# Patient Record
Sex: Male | Born: 1970 | Race: Black or African American | Hispanic: No | Marital: Married | Smoking: Former smoker
Health system: Southern US, Community
[De-identification: ages and names within clinical notes are randomized; demographics above are authoritative.]

## PROBLEM LIST (undated history)

## (undated) DIAGNOSIS — K219 Gastro-esophageal reflux disease without esophagitis: Secondary | ICD-10-CM

## (undated) DIAGNOSIS — J45909 Unspecified asthma, uncomplicated: Secondary | ICD-10-CM

## (undated) DIAGNOSIS — I10 Essential (primary) hypertension: Secondary | ICD-10-CM

## (undated) HISTORY — DX: Gastro-esophageal reflux disease without esophagitis: K21.9

## (undated) HISTORY — DX: Essential (primary) hypertension: I10

## (undated) HISTORY — PX: BACK SURGERY: SHX140

## (undated) HISTORY — DX: Unspecified asthma, uncomplicated: J45.909

---

## 1999-07-18 HISTORY — PX: WRIST ARTHROCENTESIS: SUR48

## 2000-01-03 ENCOUNTER — Emergency Department (HOSPITAL_COMMUNITY): Admission: EM | Admit: 2000-01-03 | Discharge: 2000-01-03 | Payer: Self-pay | Admitting: Emergency Medicine

## 2001-02-04 ENCOUNTER — Emergency Department (HOSPITAL_COMMUNITY): Admission: EM | Admit: 2001-02-04 | Discharge: 2001-02-04 | Payer: Self-pay | Admitting: Emergency Medicine

## 2001-05-24 ENCOUNTER — Emergency Department (HOSPITAL_COMMUNITY): Admission: EM | Admit: 2001-05-24 | Discharge: 2001-05-24 | Payer: Self-pay | Admitting: Emergency Medicine

## 2001-12-09 ENCOUNTER — Emergency Department (HOSPITAL_COMMUNITY): Admission: EM | Admit: 2001-12-09 | Discharge: 2001-12-09 | Payer: Self-pay | Admitting: Emergency Medicine

## 2001-12-14 ENCOUNTER — Encounter: Payer: Self-pay | Admitting: Family Medicine

## 2001-12-14 ENCOUNTER — Ambulatory Visit (HOSPITAL_COMMUNITY): Admission: RE | Admit: 2001-12-14 | Discharge: 2001-12-14 | Payer: Self-pay | Admitting: Family Medicine

## 2002-04-28 ENCOUNTER — Emergency Department (HOSPITAL_COMMUNITY): Admission: EM | Admit: 2002-04-28 | Discharge: 2002-04-28 | Payer: Self-pay | Admitting: Emergency Medicine

## 2002-07-19 ENCOUNTER — Encounter: Payer: Self-pay | Admitting: Family Medicine

## 2002-07-19 ENCOUNTER — Ambulatory Visit: Admission: RE | Admit: 2002-07-19 | Discharge: 2002-07-19 | Payer: Self-pay | Admitting: Family Medicine

## 2002-10-11 ENCOUNTER — Emergency Department (HOSPITAL_COMMUNITY): Admission: EM | Admit: 2002-10-11 | Discharge: 2002-10-11 | Payer: Self-pay | Admitting: *Deleted

## 2003-09-25 ENCOUNTER — Ambulatory Visit (HOSPITAL_COMMUNITY): Admission: RE | Admit: 2003-09-25 | Discharge: 2003-09-25 | Payer: Self-pay | Admitting: Orthopedic Surgery

## 2005-01-08 ENCOUNTER — Emergency Department (HOSPITAL_COMMUNITY): Admission: EM | Admit: 2005-01-08 | Discharge: 2005-01-08 | Payer: Self-pay | Admitting: Emergency Medicine

## 2005-05-08 ENCOUNTER — Encounter: Admission: RE | Admit: 2005-05-08 | Discharge: 2005-08-06 | Payer: Self-pay | Admitting: *Deleted

## 2005-05-11 ENCOUNTER — Ambulatory Visit (HOSPITAL_COMMUNITY): Admission: RE | Admit: 2005-05-11 | Discharge: 2005-05-11 | Payer: Self-pay | Admitting: *Deleted

## 2005-06-05 ENCOUNTER — Ambulatory Visit (HOSPITAL_COMMUNITY): Admission: RE | Admit: 2005-06-05 | Discharge: 2005-06-06 | Payer: Self-pay | Admitting: Neurosurgery

## 2005-11-10 ENCOUNTER — Emergency Department (HOSPITAL_COMMUNITY): Admission: EM | Admit: 2005-11-10 | Discharge: 2005-11-10 | Payer: Self-pay | Admitting: *Deleted

## 2006-02-12 ENCOUNTER — Emergency Department (HOSPITAL_COMMUNITY): Admission: EM | Admit: 2006-02-12 | Discharge: 2006-02-12 | Payer: Self-pay | Admitting: Emergency Medicine

## 2006-04-26 ENCOUNTER — Ambulatory Visit (HOSPITAL_COMMUNITY): Admission: RE | Admit: 2006-04-26 | Discharge: 2006-04-26 | Payer: Self-pay | Admitting: Family Medicine

## 2007-08-19 ENCOUNTER — Emergency Department (HOSPITAL_COMMUNITY): Admission: EM | Admit: 2007-08-19 | Discharge: 2007-08-19 | Payer: Self-pay | Admitting: Emergency Medicine

## 2009-02-17 ENCOUNTER — Encounter: Admission: RE | Admit: 2009-02-17 | Discharge: 2009-02-17 | Payer: Self-pay | Admitting: Neurosurgery

## 2010-12-02 NOTE — Op Note (Signed)
Damon Payne, [name] NO.:  [id number]   MEDICAL RECORD NO.:  [id number]          PATIENT TYPE:  OIB   LOCATION:  3004                         FACILITY:  [facility]   PHYSICIAN:  Cristi Loron, M.D.DATE OF BIRTH:  1971-01-05   DATE OF PROCEDURE:  06/06/2005  DATE OF DISCHARGE:  06/06/2005                                 OPERATIVE REPORT   BRIEF HISTORY:  The patient is a 40 year old black male, who has suffered  from back and right leg pain.  He had failed medical management and was  worked up with a lumbar MRI, which demonstrated a herniated disk at L2-3 on  the right.  I discussed the various treatment options with him including  surgery.  The patient weighed the risks, benefits, and alternatives to  surgery and decided to proceed with a right L2-3 microdiskectomy.   PREOPERATIVE DIAGNOSES:  1.  Right L2-3 herniated nucleus pulposus.  2.  Spinal stenosis.  3.  Lumbar radiculopathy with lumbago.   POSTOPERATIVE DIAGNOSES:  1.  Right L2-3 herniated nucleus pulposus.  2.  Spinal stenosis.  3.  Lumbar radiculopathy with lumbago.   PROCEDURE:  Right L2-3 microdiskectomy using microdissection.   SURGEON:  Cristi Loron, MD.   ASSISTANT:  Stefani Dama, MD.   ANESTHESIA:  General endotracheal.   ESTIMATED BLOOD LOSS:  Minimal.   SPECIMENS:  None.   DRAINS:  None.   COMPLICATIONS:  None.   DESCRIPTION OF PROCEDURE:  The patient was brought to the operating room by  the Anesthesia team.  General endotracheal anesthesia was induced.  The  patient was then carefully turned to a prone position on a Wilson frame.  His lumbosacral region was then prepared with Betadine scrub and Betadine  solution.  Sterile drapes were applied.  I then injected the area to be  incised with Marcaine with epinephrine solution.  I then used a scalpel to  make a linear midline incision over the L2-3 interspace.  I used the  electrocautery to perform a right-sided  subperiosteal dissection exposing  the right spinous process of the lamina of L-2 and L-3.  We obtained  intraoperative radiographs to confirm our location.  We then inserted the  [facility] retractor and then brought the operative microscope into the  field, and under image magnification and illumination completed the  microdiskectomy/decompression.  We used the drill to perform a right L2  laminotomy.  We ran the laminotomy with a Kerrison punch, removing the L2-3  ligamentum flavum and performing a foraminotomy about the right L-3 nerve  root.  We then used microdissection to free up the thecal sac from the  epidural tissue, and then Dr. Danielle Dess carefully retracted the thecal sac  medially with the [employer] retractor.  This exposed an underlying free  fragment of disk herniation.  We removed it in multiple fragments using  pituitary forceps.  We then inspected the intervertebral disk.  There was a  hole in the annulus, but no impending herniation, so we therefore did not go  into the intervertebral disk space.  We then palpated along the ventral  surface  of the thecal sac, along the exit route L-3 nerve root and into the  foramen and noted that there was no more disk herniation and the neural  structures were well decompressed.  We then obtained hemostasis using  bipolar electrocautery.  We irrigated the wound out with Bacitracin  solution.  We then removed the Bacitracin solution and the [facility]  retractor, and we then reapproximated the patient's thoracolumbar fascia  with interrupted #1 Vicryl suture, the subcutaneous tissue with interrupted  2-0 Vicryl suture, and the skin with Steri-Strips and Benzoin.  The wound  was then coated with Bacitracin ointment, and a sterile dressing was applied  after the drapes were removed.  The patient was subsequently returned to a  supine position where he was extubated by the Anesthesia team and  transported to the post anesthesia care unit in  stable condition.  All  sponge, instrument, and needle counts were correct at the end of this case.      Cristi Loron, M.D.  Electronically Signed     JDJ/MEDQ  D:  06/06/2005  T:  06/06/2005  Job:  [id number]

## 2010-12-31 ENCOUNTER — Other Ambulatory Visit: Payer: Self-pay | Admitting: Internal Medicine

## 2011-01-10 ENCOUNTER — Other Ambulatory Visit: Payer: Self-pay | Admitting: Internal Medicine

## 2011-08-29 ENCOUNTER — Other Ambulatory Visit: Payer: Self-pay | Admitting: Family Medicine

## 2011-08-29 DIAGNOSIS — N5089 Other specified disorders of the male genital organs: Secondary | ICD-10-CM

## 2011-09-18 ENCOUNTER — Ambulatory Visit
Admission: RE | Admit: 2011-09-18 | Discharge: 2011-09-18 | Disposition: A | Discharge status: Home / Self Care | Payer: 59 | Source: Ambulatory Visit | Attending: Family Medicine | Admitting: Family Medicine

## 2011-09-18 DIAGNOSIS — N5089 Other specified disorders of the male genital organs: Secondary | ICD-10-CM

## 2013-08-06 ENCOUNTER — Ambulatory Visit
Admission: RE | Admit: 2013-08-06 | Discharge: 2013-08-06 | Disposition: A | Discharge status: Home / Self Care | Payer: BC Managed Care – PPO | Source: Ambulatory Visit | Attending: Family Medicine | Admitting: Family Medicine

## 2013-08-06 ENCOUNTER — Other Ambulatory Visit: Payer: Self-pay | Admitting: Family Medicine

## 2013-08-06 DIAGNOSIS — M25531 Pain in right wrist: Secondary | ICD-10-CM

## 2014-12-11 ENCOUNTER — Other Ambulatory Visit: Payer: Self-pay | Admitting: Family Medicine

## 2014-12-11 DIAGNOSIS — G542 Cervical root disorders, not elsewhere classified: Secondary | ICD-10-CM

## 2014-12-15 ENCOUNTER — Other Ambulatory Visit: Payer: Self-pay

## 2015-01-21 ENCOUNTER — Ambulatory Visit
Admission: RE | Admit: 2015-01-21 | Discharge: 2015-01-21 | Disposition: A | Discharge status: Home / Self Care | Payer: BLUE CROSS/BLUE SHIELD | Source: Ambulatory Visit | Attending: Family Medicine | Admitting: Family Medicine

## 2015-01-21 DIAGNOSIS — G542 Cervical root disorders, not elsewhere classified: Secondary | ICD-10-CM

## 2015-06-03 ENCOUNTER — Ambulatory Visit: Payer: BLUE CROSS/BLUE SHIELD | Admitting: Allergy and Immunology

## 2015-11-18 ENCOUNTER — Encounter: Payer: Self-pay | Admitting: Pediatrics

## 2015-11-18 ENCOUNTER — Ambulatory Visit (INDEPENDENT_AMBULATORY_CARE_PROVIDER_SITE_OTHER): Payer: BLUE CROSS/BLUE SHIELD | Admitting: Pediatrics

## 2015-11-18 VITALS — BP 110/76 | HR 80 | Temp 98.0°F | Resp 16 | Ht 74.02 in | Wt 255.5 lb

## 2015-11-18 DIAGNOSIS — J4520 Mild intermittent asthma, uncomplicated: Secondary | ICD-10-CM | POA: Insufficient documentation

## 2015-11-18 DIAGNOSIS — J301 Allergic rhinitis due to pollen: Secondary | ICD-10-CM

## 2015-11-18 MED ORDER — ALBUTEROL SULFATE 108 (90 BASE) MCG/ACT IN AEPB: 90.0000 ug | INHALATION_SPRAY | RESPIRATORY_TRACT | Status: AC | PRN

## 2015-11-18 NOTE — Progress Notes (Signed)
[address] [place] [address] Dept: [phone]  New Patient Note  Patient ID: Damon Payne, male    DOB: 1970-09-19  Age: 45 y.o. MRN: [id number] Date of Office Visit: 11/18/2015 Referring provider: Johny Blamer, MD [phone] [address] [place], [place] [address]    Chief Complaint: Asthma; Allergies; and Sinus Problem  HPI Damon Payne presents for evaluation of allergic rhinitis. He had asthma in childhood and now has chest tightness only when he has a sinus infection. He used to have frequent sinus infections and took allergy injections for 4-5 years starting 10 years ago. His symptoms improved significantly. During the past 2 years he has been having more nasal congestion. He is reportedly allergic to pollens and cat. He has aggravation of his nasal symptoms on exposure to dust ,cigarette smoke, the springtime and cats. His symptoms of nasal congestion are perennial He has used al Pro-air inhaler in the past.  Review of Systems  Constitutional: Negative.   HENT:       History of recurrent sinus infections which improved with the use of allergy injections seeral years ago. Perennial nasal congestion  Eyes:       Itchy eyes in the springtime  Respiratory: Negative.   Cardiovascular:       Hypertension  Gastrointestinal:       Gastroesophageal reflux  Genitourinary: Negative.   Musculoskeletal: Negative.   Skin: Negative.   Neurological: Negative.   Endo/Heme/Allergies:       Allergy to cats  Psychiatric/Behavioral: Negative.     Outpatient Encounter Prescriptions as of 11/18/2015  Medication Sig  . Amlodipine-Valsartan-HCTZ 5-160-12.5 MG TABS   . ANDROGEL PUMP 20.25 MG/ACT (1.62%) GEL   . cetirizine (ZYRTEC) 10 MG tablet Take 10 mg by mouth daily.  . fluticasone (FLONASE) 50 MCG/ACT nasal spray   . HYDROcodone-acetaminophen (NORCO) 10-325 MG tablet   . omeprazole (PRILOSEC) 20 MG capsule   . valACYclovir (VALTREX) 500 MG tablet   . VIAGRA  100 MG tablet   . Albuterol Sulfate (PROAIR RESPICLICK) 108 (90 Base) MCG/ACT AEPB Inhale 90 mcg into the lungs every 4 (four) hours as needed.   No facility-administered encounter medications on file as of 11/18/2015.     Drug Allergies:  Allergies  Allergen Reactions  . Other     All tree nuts    Family History: Jerrian's family history includes Asthma in his son; Eczema in his daughter; Heart disease in his mother..No family history of hives, food allergies , chronic bronchitis or emphysema  Social and environmental. He is on Psychologist, prison and probation services. His symptoms are not worse at work. There are no pets in the home. He has never smoked cigarettes. He is not exposed to cigarette smoking.  Physical Exam: BP 110/76 mmHg  Pulse 80  Temp(Src) 98 F (36.7 C) (Oral)  Resp 16  Ht 6' 2.02" (1.88 m)  Wt 255 lb 8.2 oz (115.9 kg)  BMI 32.79 kg/m2   Physical Exam  Constitutional: He is oriented to person, place, and time. He appears well-developed and well-nourished.  HENT:  Eyes normal. Ears normal. Nose moderate swelling of nasal turbinates with clear nasal discharge. Pharynx normal.  Neck: Neck supple. No thyromegaly present.  Cardiovascular:  S1 and S2 normal no murmurs  Pulmonary/Chest:  Clear to percussion auscultation  Abdominal: Soft. There is no tenderness (no hepatosplenomegaly).  Lymphadenopathy:    He has no cervical adenopathy.  Neurological: He is alert and oriented to person, place, and time.  Skin:  Clear  Psychiatric: He has a normal mood and affect. His behavior is normal. Judgment and thought content normal.  Vitals reviewed.   Diagnostics: Allergy skin tests were extremely positive to grass pollens, weeds, tree pollens, molds, dust mite, cat, dog.  FVC 4.18 L FEV1 3.01 L. Predicted FVC 4.91 L predicted FEV1 3.97 L. After albuterol 2 puffs FVC 4.45 L FEV1 3.32 L-this shows a mild reduction in the FEV1 percent. The FEV1 did improve 10% after  albuterol   Assessment Assessment and Plan: 1. Mild intermittent asthma, uncomplicated   2. Allergic rhinitis due to pollen     Meds ordered this encounter  Medications  . Albuterol Sulfate (PROAIR RESPICLICK) 108 (90 Base) MCG/ACT AEPB    Sig: Inhale 90 mcg into the lungs every 4 (four) hours as needed.    Dispense:  1 each    Refill:  1    Patient Instructions  Zyrtec 10 mg once a day for runny nose or itchy eyes Fluticasone 2 sprays per nostril once a day for stuffy nose Add prednisone 20 mg twice a day for 3 days, 20 mg on the fourth day, 10 mg on the fifth day to bring your allergic symptoms under control Zaditor 0.025% one drop 3 times a day to prevent allergies Opcon-A one drop 3 times a day if needed for itchy eyes  He will call me if he wants to start allergy injections Pro-air Respiclick-2 puffs every 4 hours if needed for wheezing or coughing spells He will call me if he is not doing well on this treatment plan    Return in about 4 weeks (around 12/16/2015).   Thank you for the opportunity to care for this patient.  Please do not hesitate to contact me with questions.  Tonette BihariJ. A. Bardelas, M.D.  [facility] [address] [place], [place] [id number] [phone]

## 2015-11-18 NOTE — Patient Instructions (Addendum)
Zyrtec 10 mg once a day for runny nose or itchy eyes Fluticasone 2 sprays per nostril once a day for stuffy nose Add prednisone 20 mg twice a day for 3 days, 20 mg on the fourth day, 10 mg on the fifth day to bring your allergic symptoms under control Zaditor 0.025% one drop 3 times a day to prevent allergies Opcon-A one drop 3 times a day if needed for itchy eyes  He will call me if he wants to start allergy injections Pro-air Respiclick-2 puffs every 4 hours if needed for wheezing or coughing spells He will call me if he is not doing well on this treatment plan

## 2017-03-24 IMAGING — MR MR CERVICAL SPINE W/O CM
4 of 5 series · 18 of 48 positions shown · non-contrast
Comparison: Cervical spine radiographs 05/06/2012. MRI of the
cervical spine 04/26/2006.

CLINICAL DATA: Numbness, weakness, and pain into the right arm and
hand for 2 months.

EXAM:
MRI CERVICAL SPINE WITHOUT CONTRAST
TECHNIQUE: Multiplanar, multisequence MR imaging of the cervical spine was
performed. No intravenous contrast was administered.

[Series 2: T2 · sagittal · 3.0mm · 0.39mm/px · 6 of 12 slices shown (1 of 2)]
[im 1/12]
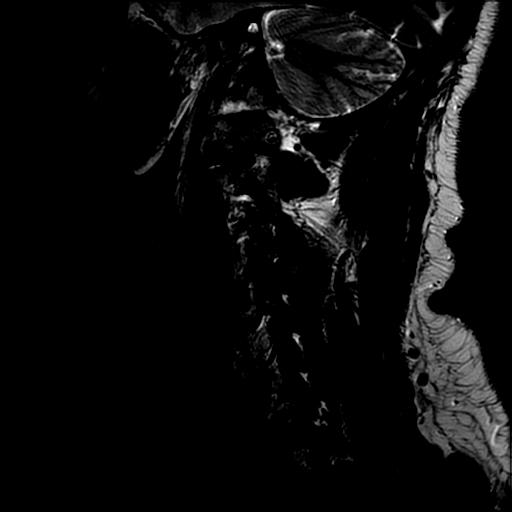
[im 3/12]
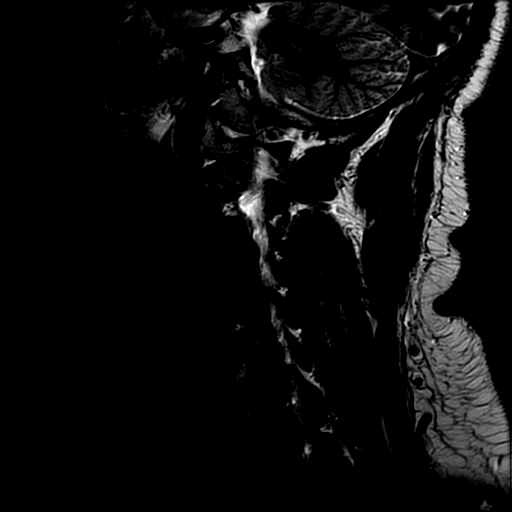
[im 5/12]
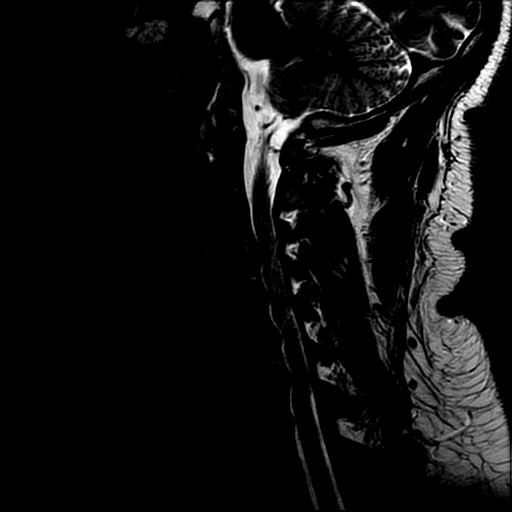
[im 7/12]
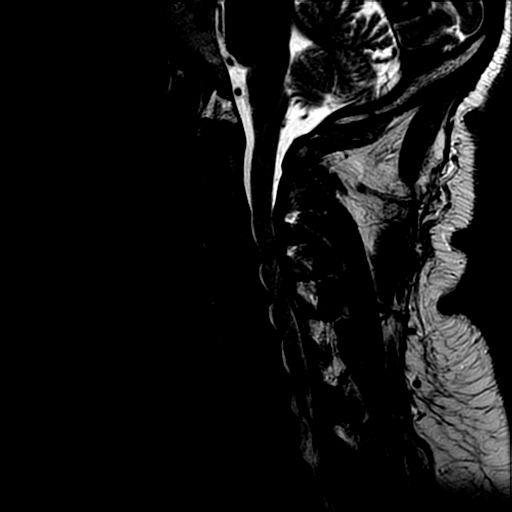
[im 9/12]
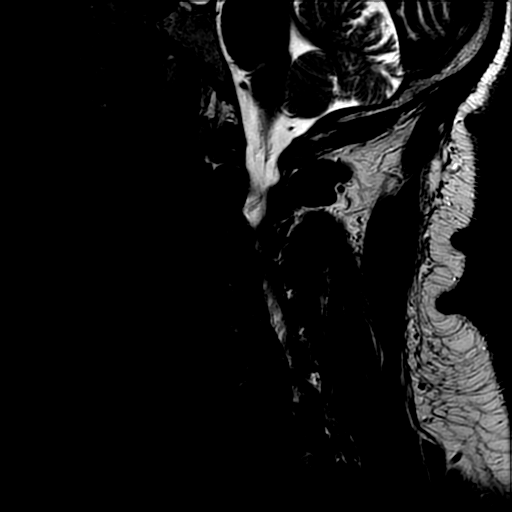
[im 12/12]
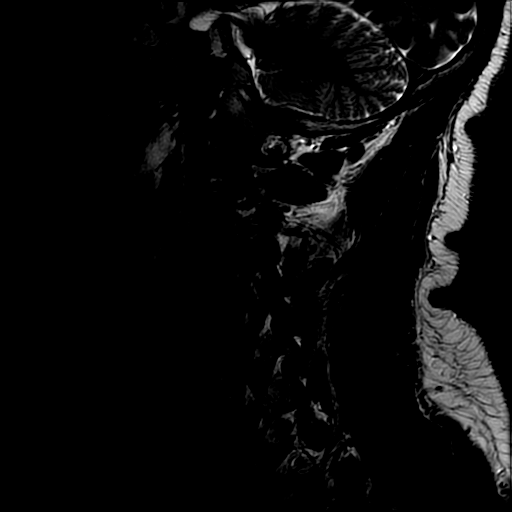

[Series 3: T1 · sagittal · 3.0mm · 0.39mm/px · 3 of 12 slices shown]
[im 3/12]
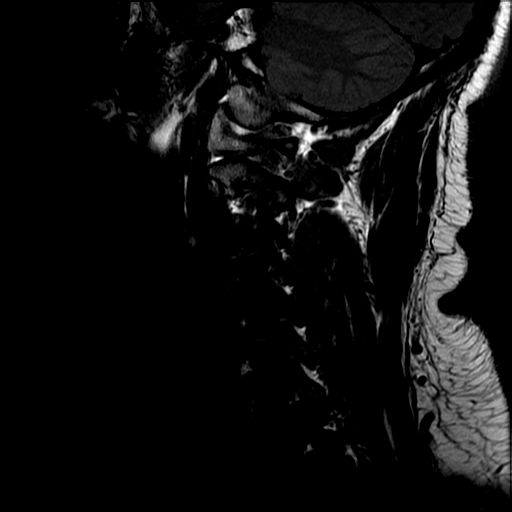
[im 7/12]
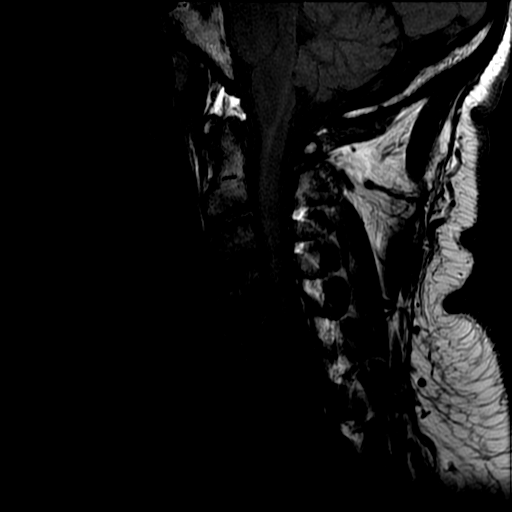
[im 12/12]
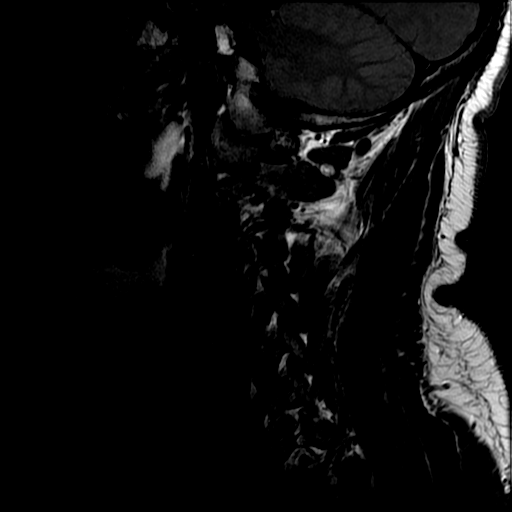

[Series 4: STIR · sagittal · 3.0mm · 0.39mm/px · 3 of 12 slices shown]
[im 3/12]
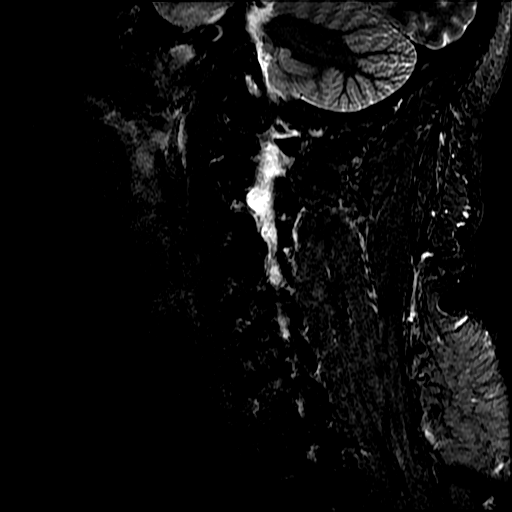
[im 7/12]
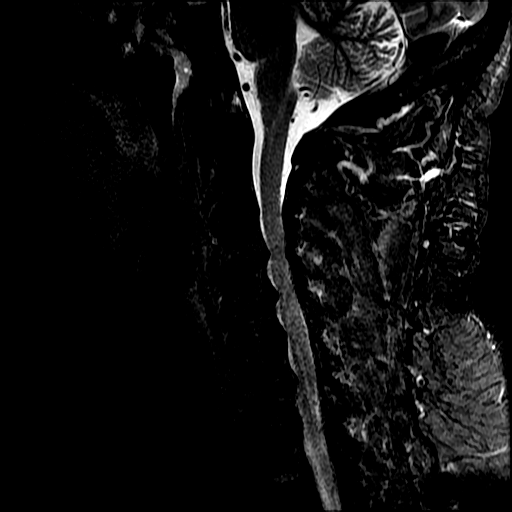
[im 12/12]
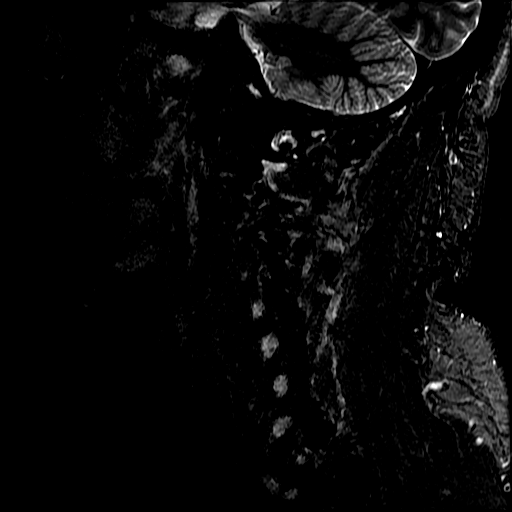

[Series 5: T2 · axial · 3.0mm · 0.39mm/px · z∈[-132,-30]mm · 6 of 32 slices shown (2 of 2)]
[im 1/32]
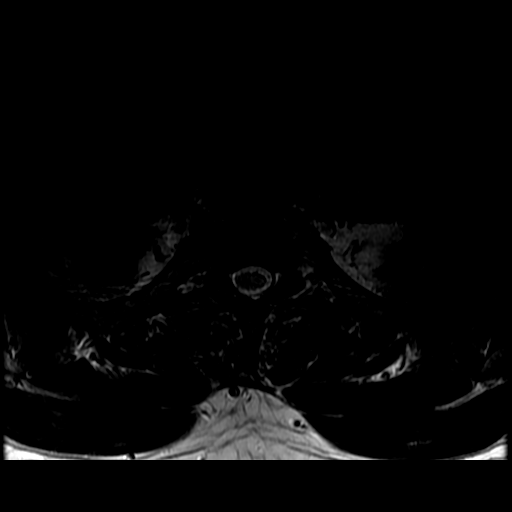
[im 5/32]
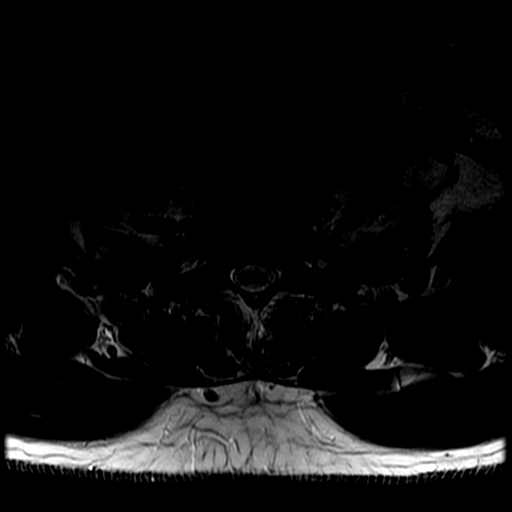
[im 9/32]
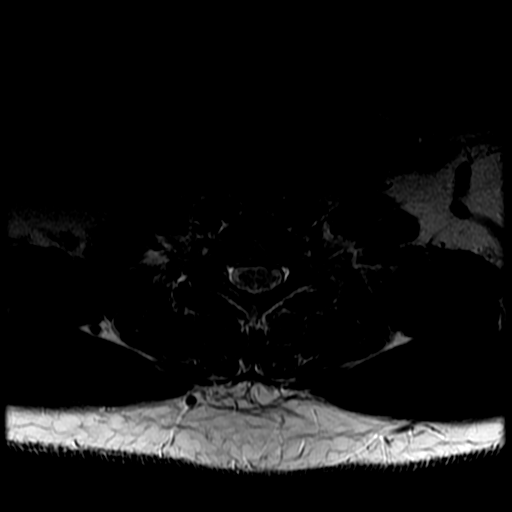
[im 14/32]
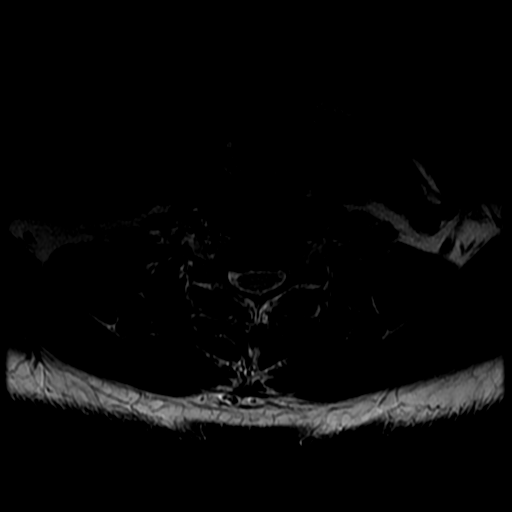
[im 16/32]
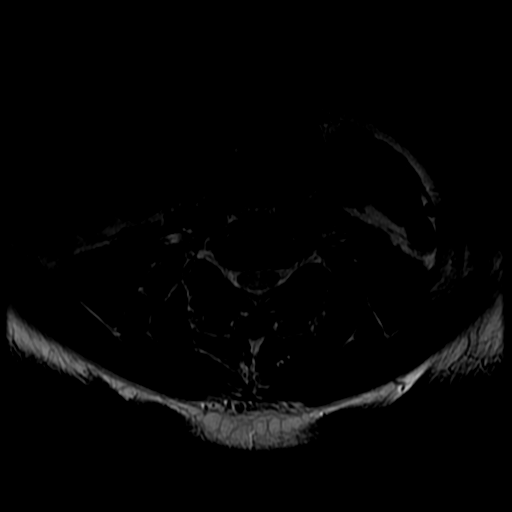
[im 27/32]
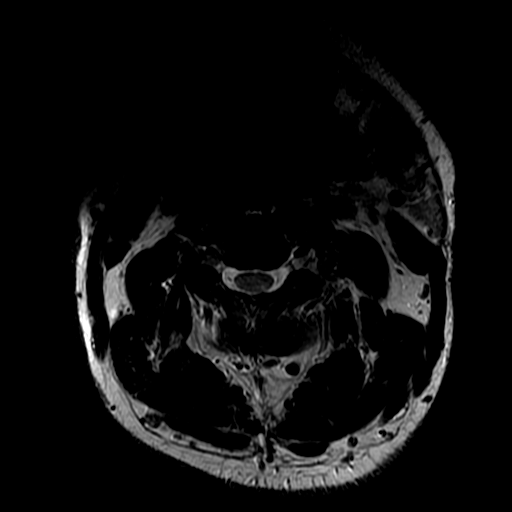

[18 of 48 positions shown; findings below may reference images not displayed]

FINDINGS: The will signal is present in the cervical and upper thoracic spinal
cord to the lowest imaged level, T1-2. Marrow signal is somewhat
depressed throughout the cervical spine. Progressive endplate
degenerative changes are noted at C5-6 and to lesser extent C6-7.

The craniocervical junction is within normal limits. Cystic change
anteriorly in the pituitary is stable. The visualized intracranial
contents are otherwise within normal limits.

Flow is present in the vertebral arteries bilaterally.

C2-3:  Negative.

C3-4: A progressive broad-based disc protrusion results in moderate
central canal stenosis. The canal is narrowed to 6.5 mm. Moderate
foraminal narrowing is worse on the right.

C4-5: A progressive broad-based disc osteophyte complex effaces the
ventral CSF and narrows the canal. There is some progression. The
central canal now measures 8 mm. Moderate to severe right and
moderate left foraminal narrowing demonstrates some progression
since prior exam.

C5-6: A broad-based disc osteophyte complex effaces the ventral CSF.
Mild central and bilateral foraminal stenosis is stable.

C6-7: A broad-based disc osteophyte complex is present. A far left
lateral extrusion is slightly reduced compared to the prior exam.
Moderate left central canal stenosis is still present. Moderate
foraminal narrowing is unchanged.

C7-T1: A mild broad-based disc protrusion is present. The central
canal is patent. Moderate foraminal stenosis is present bilaterally
with some progression since the prior exam.
IMPRESSION: 1. Progressive multilevel spondylosis of the cervical spine.
2. Moderate central canal stenosis at C3-4. The canal is narrowed to
6.5 mm.
3. Moderate foraminal stenosis at C3-4 is worse on the right.
4. Progressive broad-based disc osteophyte complex with mild to
moderate central canal stenosis, moderate to severe right, and
moderate left foraminal narrowing.
5. Mild central and bilateral foraminal stenosis at C5-6 is stable.
6. Moderate left central canal stenosis at C6-7. A far left lateral
disc extrusion is slightly reduced compared to the prior exam.
7. Moderate foraminal stenosis bilaterally at C7-T1 with some
progression since the prior exam.

## 2021-06-15 DIAGNOSIS — Z23 Encounter for immunization: Secondary | ICD-10-CM | POA: Diagnosis not present

## 2021-06-15 DIAGNOSIS — Z024 Encounter for examination for driving license: Secondary | ICD-10-CM | POA: Diagnosis not present

## 2021-07-19 DIAGNOSIS — J018 Other acute sinusitis: Secondary | ICD-10-CM | POA: Diagnosis not present

## 2021-07-19 DIAGNOSIS — Z20822 Contact with and (suspected) exposure to covid-19: Secondary | ICD-10-CM | POA: Diagnosis not present

## 2021-07-19 DIAGNOSIS — J029 Acute pharyngitis, unspecified: Secondary | ICD-10-CM | POA: Diagnosis not present

## 2021-07-19 DIAGNOSIS — Z03818 Encounter for observation for suspected exposure to other biological agents ruled out: Secondary | ICD-10-CM | POA: Diagnosis not present
